# Patient Record
Sex: Female | Born: 1990 | Race: Black or African American | Hispanic: No | Marital: Single | Smoking: Former smoker
Health system: Southern US, Community
[De-identification: ages and names within clinical notes are randomized; demographics above are authoritative.]

## PROBLEM LIST (undated history)

## (undated) HISTORY — PX: FOOT SURGERY: SHX648

---

## 2012-02-10 ENCOUNTER — Encounter (HOSPITAL_COMMUNITY): Payer: Self-pay | Admitting: Emergency Medicine

## 2012-02-10 ENCOUNTER — Emergency Department (HOSPITAL_COMMUNITY)
Admission: EM | Admit: 2012-02-10 | Discharge: 2012-02-10 | Disposition: A | Discharge status: Home / Self Care | Payer: BC Managed Care – PPO | Attending: Emergency Medicine | Admitting: Emergency Medicine

## 2012-02-10 ENCOUNTER — Emergency Department (HOSPITAL_COMMUNITY): Payer: BC Managed Care – PPO

## 2012-02-10 ENCOUNTER — Other Ambulatory Visit: Payer: Self-pay

## 2012-02-10 DIAGNOSIS — F17200 Nicotine dependence, unspecified, uncomplicated: Secondary | ICD-10-CM | POA: Insufficient documentation

## 2012-02-10 DIAGNOSIS — R079 Chest pain, unspecified: Secondary | ICD-10-CM | POA: Insufficient documentation

## 2012-02-10 LAB — POCT I-STAT, CHEM 8
Calcium, Ion: 1.24 mmol/L — ABNORMAL HIGH (ref 1.12–1.23)
Creatinine, Ser: 1 mg/dL (ref 0.50–1.10)
Glucose, Bld: 78 mg/dL (ref 70–99)
Hemoglobin: 12.9 g/dL (ref 12.0–15.0)
Potassium: 3.8 mEq/L (ref 3.5–5.1)

## 2012-02-10 MED ORDER — GI COCKTAIL ~~LOC~~
30.0000 mL | Freq: Once | ORAL | Status: AC
  Administered 2012-02-10: 30 mL via ORAL
  Filled 2012-02-10: qty 30

## 2012-02-10 NOTE — ED Notes (Signed)
States chest pain started 2 days ago, position of comfort hunched over. Denies any trauma or injury

## 2012-02-10 NOTE — ED Notes (Signed)
Pt presenting to ed with c/o chest discomfort x 2 days pt denies nausea, vomiting and shortness of breath.

## 2012-02-10 NOTE — ED Provider Notes (Signed)
History     CSN: [id number]  Arrival date & time 02/10/12  1640   First MD Initiated Contact with Patient 02/10/12 1658      Chief Complaint  Patient presents with  . Chest Pain    (Consider location/radiation/quality/duration/timing/severity/associated sxs/prior treatment) The history is provided by the patient.   patient is a healthy 21 year old female with a history of cigarette smoking who presents to the emergency department with a chief complaint of chest pain. Symptoms began 2 days ago in the evening. She took Gas-X and went to bed, awakening the next morning without any pain. Symptoms returned today around 4 PM and been constant since that time. Described as heavy and moderate. Associated with excess belching. No associated shortness of breath, nausea, vomiting. No lightheadedness, near-syncope. The pain does not radiate. There is no exertional component. It is worse with certain positions to include standing. Not worse with lying flat. No recent fever or other illness. Did drink a moderate amount of alcohol last night. Did not take any medication today for her symptoms. No family history of early coronary disease. No recent prolonged travel, no OCP use, no lower extremity pain or swelling.  History reviewed. No pertinent past medical history.  Past Surgical History  Procedure Date  . Foot surgery     No family history on file.  History  Substance Use Topics  . Smoking status: Current smoker  . Smokeless tobacco: Not on file  . Alcohol Use: Yes     occassionally     Review of Systems 10 systems reviewed and are negative for acute change except as noted in the HPI.  Allergies  Review of patient's allergies indicates no known allergies.  Home Medications  No current outpatient prescriptions on file.  BP 114/65  Pulse 88  Temp 98.8 F (37.1 C)  Resp 18  SpO2 98%  LMP 02/02/2012  Physical Exam  Nursing note reviewed. Constitutional: She appears  well-developed and well-nourished. No distress.       Vital signs are reviewed and are normal.  HENT:  Head: Normocephalic and atraumatic.  Right Ear: External ear normal.  Left Ear: External ear normal.       MMM  Eyes: Conjunctivae are normal.  Neck: Neck supple.  Cardiovascular: Normal rate, regular rhythm and normal heart sounds.  Exam reveals no friction rub.   No murmur heard. Pulmonary/Chest: Effort normal and breath sounds normal. No respiratory distress. She has no wheezes. She has no rales. She exhibits no tenderness.  Abdominal: Soft. Bowel sounds are normal. She exhibits no distension. There is no tenderness. There is no guarding.  Musculoskeletal: She exhibits no edema.       Calves supple and non-tender  Neurological: She is alert.       Speech clear. MAEW.  Skin: Skin is warm and dry.  Psychiatric: She has a normal mood and affect.    ED Course  Procedures (including critical care time)  Labs Reviewed  POCT I-STAT, CHEM 8 - Abnormal; Notable for the following:    Calcium, Ion 1.24 (*)     All other components within normal limits   Dg Chest 2 View  02/10/2012  *RADIOLOGY REPORT*  Clinical Data: Chest pain.  CHEST - 2 VIEW  Comparison: None.  Findings: The heart size is normal.  The lungs are clear.  The visualized soft tissues and bony thorax are unremarkable.  IMPRESSION: Negative chest.  Original Report Authenticated By: Jamesetta Orleans. MATTERN, M.D.  Date: 02/10/2012  Rate: 90  Rhythm: sinus  QRS Axis: normal  Intervals: normal  ST/T Wave abnormalities: normal  Conduction Disutrbances:none  Narrative Interpretation: p-wave in lead II 3mm to suggest right atrial abnormality  Old EKG Reviewed: none available    Dx 1: Chest pain   MDM  Young healthy female with epigastric and CP, 2 episodes in last 2 days. Initial episode relieved with Gas-x. EKG with no acute findings, doubt ACS, pericarditis. PERC negative, sx atypical for embolic event- doubt PE.  CXR reviewed, normal. Good improvement in ED with GI cocktail to suggest reflux as component of symptoms. Very slightly elevated calcium ion- pt will f/u with primary doctor for recheck in 1 week. Discussed use of maalox after meals as needed for symptoms with return precautions stressed.        Shaaron Adler, [place] 02/10/12 [phone]

## 2012-02-11 NOTE — ED Provider Notes (Signed)
Medical screening examination/treatment/procedure(s) were performed by non-physician practitioner and as supervising physician I was immediately available for consultation/collaboration.  Rennee Coyne L Chaniece Barbato, MD 02/11/12 0055 

## 2013-04-08 ENCOUNTER — Emergency Department (HOSPITAL_COMMUNITY)
Admission: EM | Admit: 2013-04-08 | Discharge: 2013-04-08 | Disposition: A | Discharge status: Home / Self Care | Payer: BC Managed Care – PPO | Attending: Emergency Medicine | Admitting: Emergency Medicine

## 2013-04-08 ENCOUNTER — Emergency Department (HOSPITAL_COMMUNITY): Payer: BC Managed Care – PPO

## 2013-04-08 ENCOUNTER — Encounter (HOSPITAL_COMMUNITY): Payer: Self-pay | Admitting: Nurse Practitioner

## 2013-04-08 DIAGNOSIS — Y9389 Activity, other specified: Secondary | ICD-10-CM | POA: Insufficient documentation

## 2013-04-08 DIAGNOSIS — R110 Nausea: Secondary | ICD-10-CM | POA: Insufficient documentation

## 2013-04-08 DIAGNOSIS — IMO0002 Reserved for concepts with insufficient information to code with codable children: Secondary | ICD-10-CM | POA: Insufficient documentation

## 2013-04-08 DIAGNOSIS — M549 Dorsalgia, unspecified: Secondary | ICD-10-CM

## 2013-04-08 DIAGNOSIS — O99330 Smoking (tobacco) complicating pregnancy, unspecified trimester: Secondary | ICD-10-CM | POA: Insufficient documentation

## 2013-04-08 DIAGNOSIS — O9989 Other specified diseases and conditions complicating pregnancy, childbirth and the puerperium: Secondary | ICD-10-CM | POA: Insufficient documentation

## 2013-04-08 DIAGNOSIS — S8990XA Unspecified injury of unspecified lower leg, initial encounter: Secondary | ICD-10-CM | POA: Insufficient documentation

## 2013-04-08 DIAGNOSIS — M25562 Pain in left knee: Secondary | ICD-10-CM

## 2013-04-08 DIAGNOSIS — Y92410 Unspecified street and highway as the place of occurrence of the external cause: Secondary | ICD-10-CM | POA: Insufficient documentation

## 2013-04-08 LAB — URINALYSIS, ROUTINE W REFLEX MICROSCOPIC
Ketones, ur: NEGATIVE mg/dL
Leukocytes, UA: NEGATIVE
Nitrite: NEGATIVE
Specific Gravity, Urine: 1.017 (ref 1.005–1.030)
Urobilinogen, UA: 1 mg/dL (ref 0.0–1.0)
pH: 8.5 — ABNORMAL HIGH (ref 5.0–8.0)
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
## 2013-04-11 NOTE — ED Provider Notes (Signed)
Medical screening examination/treatment/procedure(s) were performed by non-physician practitioner and as supervising physician I was immediately available for consultation/collaboration.   Glynn Octave, MD 04/11/13 [phone]

## 2013-11-24 ENCOUNTER — Ambulatory Visit (INDEPENDENT_AMBULATORY_CARE_PROVIDER_SITE_OTHER): Payer: BC Managed Care – PPO | Admitting: Emergency Medicine

## 2013-11-24 VITALS — BP 104/70 | HR 78 | Temp 98.3°F | Resp 14 | Ht 64.0 in | Wt 158.0 lb

## 2013-11-24 DIAGNOSIS — H18829 Corneal disorder due to contact lens, unspecified eye: Secondary | ICD-10-CM

## 2013-11-24 MED ORDER — ACETAMINOPHEN-CODEINE #3 300-30 MG PO TABS: 1.0000 | ORAL_TABLET | ORAL | Status: DC | PRN

## 2013-11-24 MED ORDER — TOBRAMYCIN 0.3 % OP SOLN
1.0000 [drp] | OPHTHALMIC | Status: DC
Start: 2013-11-24 — End: 2015-07-26

## 2013-11-24 NOTE — Progress Notes (Signed)
[facility] [address], [place] [id number] [phone]  Date:  11/24/2013   Name:  Michelle James   DOB:  12/21/1990   MRN:  [id number]  PCP:  Pcp Not In System    Chief Complaint: Eye Pain   History of Present Illness:  Michelle James is a 23 y.o. very pleasant female patient who presents with the following:  Slept in daily wear soft contacts.  Now has pain in right eye and photophobia.  No history of foreign body exposure. No visual complaints.  No coryza, fever or chills.  No improvement with over the counter medications or other home remedies. Denies other complaint or health concern today.   There are no active problems to display for this patient.   History reviewed. No pertinent past medical history.  Past Surgical History  Procedure Laterality Date  . Foot surgery      History  Substance Use Topics  . Smoking status: Former Smoker    Types: Cigarettes  . Smokeless tobacco: Not on file  . Alcohol Use: Yes     Comment: occassionally    Family History  Problem Relation Age of Onset  . Diabetes Maternal Grandfather   . Heart disease Paternal Grandfather     No Known Allergies  Medication list has been reviewed and updated.  Current Outpatient Prescriptions on File Prior to Visit  Medication Sig Dispense Refill  . Multiple Vitamin (MULTIVITAMIN WITH MINERALS) TABS tablet Take 1 tablet by mouth daily.      . Soft Lens Products (REFRESH CONTACTS DROPS) SOLN by Does not apply route.       No current facility-administered medications on file prior to visit.    Review of Systems:  As per HPI, otherwise negative.    Physical Examination: Filed Vitals:   11/24/13 1234  BP: 104/70  Pulse: 78  Temp: 98.3 F (36.8 C)  Resp: 14   Filed Vitals:   11/24/13 1234  Height: 5\' 4"  (1.626 m)  Weight: 158 lb (71.668 kg)   Body mass index is 27.11 kg/(m^2). Ideal Body Weight: Weight in (lb) to have BMI = 25: 145.3   GEN: WDWN,  NAD, Non-toxic, Alert & Oriented x 3 HEENT: Atraumatic, Normocephalic.   Right eye marked injection.  PRRERLA EOMI no FB.  No hyphema or hypopion.  No fluorescein uptake Ears and Nose: No external deformity. EXTR: No clubbing/cyanosis/edema NEURO: Normal gait.  PSYCH: Normally interactive. Conversant. Not depressed or anxious appearing.  Calm demeanor.   Assessment and Plan: Contact lens overwear tobrex Tylenol #3  Signed,  Phillips OdorJeffery Nature Kueker, MD
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
## 2015-06-09 ENCOUNTER — Ambulatory Visit (INDEPENDENT_AMBULATORY_CARE_PROVIDER_SITE_OTHER): Payer: BLUE CROSS/BLUE SHIELD | Admitting: Family Medicine

## 2015-06-09 VITALS — BP 124/82 | HR 110 | Temp 97.0°F | Resp 18 | Ht 65.0 in | Wt 150.8 lb

## 2015-06-09 DIAGNOSIS — H109 Unspecified conjunctivitis: Secondary | ICD-10-CM | POA: Diagnosis not present

## 2015-06-09 MED ORDER — TOBRAMYCIN-DEXAMETHASONE 0.3-0.1 % OP SUSP: 1.0000 [drp] | Freq: Two times a day (BID) | OPHTHALMIC | Status: DC

## 2015-06-09 NOTE — Progress Notes (Addendum)
This chart was scribed for Michelle SidleKurt Lauenstein, MD by Stann Oresung-Kai Tsai, medical scribe at [facility].The patient was seen in exam room 13 and the patient's care was started at 1:19 PM.  Patient ID: Michelle James MRN: [id number], DOB: 06/02/1991, 23 y.o. Date of Encounter: 06/09/2015  Primary Physician: Pcp Not In System  Chief Complaint:  Chief Complaint  Patient presents with   Eye Injury     right eye irritated for use contac lenses.    HPI:  Michelle James is a 24 y.o. female who [facility] complaining of right eye irritation after using contact lenses.  Patient left her contacts in overnight and woke up with pain, redness, and photophobia today. History reviewed. No pertinent past medical history.   Home Meds: Prior to Admission medications   Medication Sig Start Date End Date Taking? Authorizing Provider  acetaminophen-codeine (TYLENOL #3) 300-30 MG per tablet Take 1-2 tablets by mouth every 4 (four) hours as needed. 11/24/13  Yes Carmelina DaneJeffery S Anderson, MD  Multiple Vitamin (MULTIVITAMIN WITH MINERALS) TABS tablet Take 1 tablet by mouth daily.   Yes Historical Provider, MD  Soft Lens Products (REFRESH CONTACTS DROPS) SOLN by Does not apply route.    Historical Provider, MD  tobramycin (TOBREX) 0.3 % ophthalmic solution Place 1 drop into the right eye every 4 (four) hours. Patient not taking: Reported on 06/09/2015 11/24/13   Carmelina DaneJeffery S Anderson, MD    Allergies: No Known Allergies  Social History   Social History   Marital Status: Single    Spouse Name: N/A   Number of Children: N/A   Years of Education: N/A   Occupational History   Not on file.   Social History Main Topics   Smoking status: Former Smoker    Types: Cigarettes   Smokeless tobacco: Not on file   Alcohol Use: Yes     Comment: occassionally   Drug Use: No   Sexual Activity: Not on file   Other Topics Concern   Not on file   Social History  Narrative     Review of Systems: Constitutional: negative for fever, chills, night sweats, weight changes, or fatigue  HEENT: negative for vision changes, hearing loss, congestion, rhinorrhea, ST, epistaxis, or sinus pressure; positive for eye irritation Cardiovascular: negative for chest pain or palpitations Respiratory: negative for hemoptysis, wheezing, shortness of breath, or cough Abdominal: negative for abdominal pain, nausea, vomiting, diarrhea, or constipation Dermatological: negative for rash Neurologic: negative for headache, dizziness, or syncope All other systems reviewed and are otherwise negative with the exception to those above and in the HPI.  Physical Exam: Blood pressure 124/82, pulse 110, temperature 97 F (36.1 C), temperature source Oral, resp. rate 18, height 5\' 5"  (1.651 m), weight 150 lb 12.8 oz (68.402 kg), last menstrual period 05/20/2015, SpO2 97 %., Body mass index is 25.09 kg/(m^2). General: Well developed, well nourished, in no acute distress. Head: Both conjunctiva are injected with perilimbal predominance, marked watering. Msk:  Strength and tone normal for age. Extremities/Skin: Warm and dry. No clubbing or cyanosis. No edema. No rashes or suspicious lesions. Neuro: Alert and oriented X 3. Moves all extremities spontaneously. Gait is normal. CNII-XII grossly in tact. Psych:  Responds to questions appropriately with a normal affect.   Labs:  ASSESSMENT AND PLAN:  24 y.o. year old female with  This chart was scribed in my presence and reviewed by me personally.    ICD-9-CM ICD-10-CM   1.  Bilateral conjunctivitis 372.30 H10.9 tobramycin-dexamethasone (TOBRADEX) ophthalmic solution     By signing my name below, I, Stann Ore, attest that this documentation has been prepared under the direction and in the presence of Michelle Sidle, MD. Electronically Signed: Stann Ore, Scribe. 06/09/2015 , 1:22 PM .  Signed, Michelle Sidle,  MD 06/09/2015 1:22 PM

## 2015-07-26 ENCOUNTER — Emergency Department (HOSPITAL_BASED_OUTPATIENT_CLINIC_OR_DEPARTMENT_OTHER)
Admission: EM | Admit: 2015-07-26 | Discharge: 2015-07-26 | Disposition: A | Discharge status: Home / Self Care | Payer: BLUE CROSS/BLUE SHIELD | Attending: Emergency Medicine | Admitting: Emergency Medicine

## 2015-07-26 ENCOUNTER — Encounter (HOSPITAL_BASED_OUTPATIENT_CLINIC_OR_DEPARTMENT_OTHER): Payer: Self-pay | Admitting: Emergency Medicine

## 2015-07-26 DIAGNOSIS — Y998 Other external cause status: Secondary | ICD-10-CM | POA: Diagnosis not present

## 2015-07-26 DIAGNOSIS — S161XXA Strain of muscle, fascia and tendon at neck level, initial encounter: Secondary | ICD-10-CM | POA: Diagnosis not present

## 2015-07-26 DIAGNOSIS — S199XXA Unspecified injury of neck, initial encounter: Secondary | ICD-10-CM | POA: Diagnosis present

## 2015-07-26 DIAGNOSIS — Y9389 Activity, other specified: Secondary | ICD-10-CM | POA: Diagnosis not present

## 2015-07-26 DIAGNOSIS — S39012A Strain of muscle, fascia and tendon of lower back, initial encounter: Secondary | ICD-10-CM | POA: Diagnosis not present

## 2015-07-26 DIAGNOSIS — Y92410 Unspecified street and highway as the place of occurrence of the external cause: Secondary | ICD-10-CM | POA: Diagnosis not present

## 2015-07-26 DIAGNOSIS — Z87891 Personal history of nicotine dependence: Secondary | ICD-10-CM | POA: Diagnosis not present

## 2015-07-26 MED ORDER — CYCLOBENZAPRINE HCL 10 MG PO TABS: 10.0000 mg | ORAL_TABLET | Freq: Three times a day (TID) | ORAL | Status: AC | PRN

## 2015-07-26 MED ORDER — HYDROCODONE-ACETAMINOPHEN 5-325 MG PO TABS: 1.0000 | ORAL_TABLET | Freq: Four times a day (QID) | ORAL | Status: AC | PRN

## 2015-07-26 NOTE — ED Notes (Signed)
Pt seen and dispositioned for d/c by EDP prior to RN assessment, care assumed at time of d/c, see MD notes, orders received and initiated.
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
